# Patient Record
Sex: Male | Born: 1995 | Race: White | Hispanic: No | Marital: Single | Smoking: Never smoker
Health system: Southern US, Community
[De-identification: ages and names within clinical notes are randomized; demographics above are authoritative.]

## PROBLEM LIST (undated history)

## (undated) DIAGNOSIS — I10 Essential (primary) hypertension: Secondary | ICD-10-CM

## (undated) HISTORY — PX: FRACTURE SURGERY: SHX138

## (undated) HISTORY — PX: HERNIA REPAIR: SHX51

## (undated) HISTORY — PX: NASAL SINUS SURGERY: SHX719

---
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
## 2015-02-27 IMAGING — CR DG HAND COMPLETE 3+V*L*
1 series · 3 of 3 positions shown · non-contrast
Comparison: None.

CLINICAL DATA: Penetrating trauma by a staple.

EXAM:
LEFT HAND - COMPLETE 3+ VIEW

[Series 1: pa · 0.17mm/px · 3 of 3 slices shown]
[im 1/3]
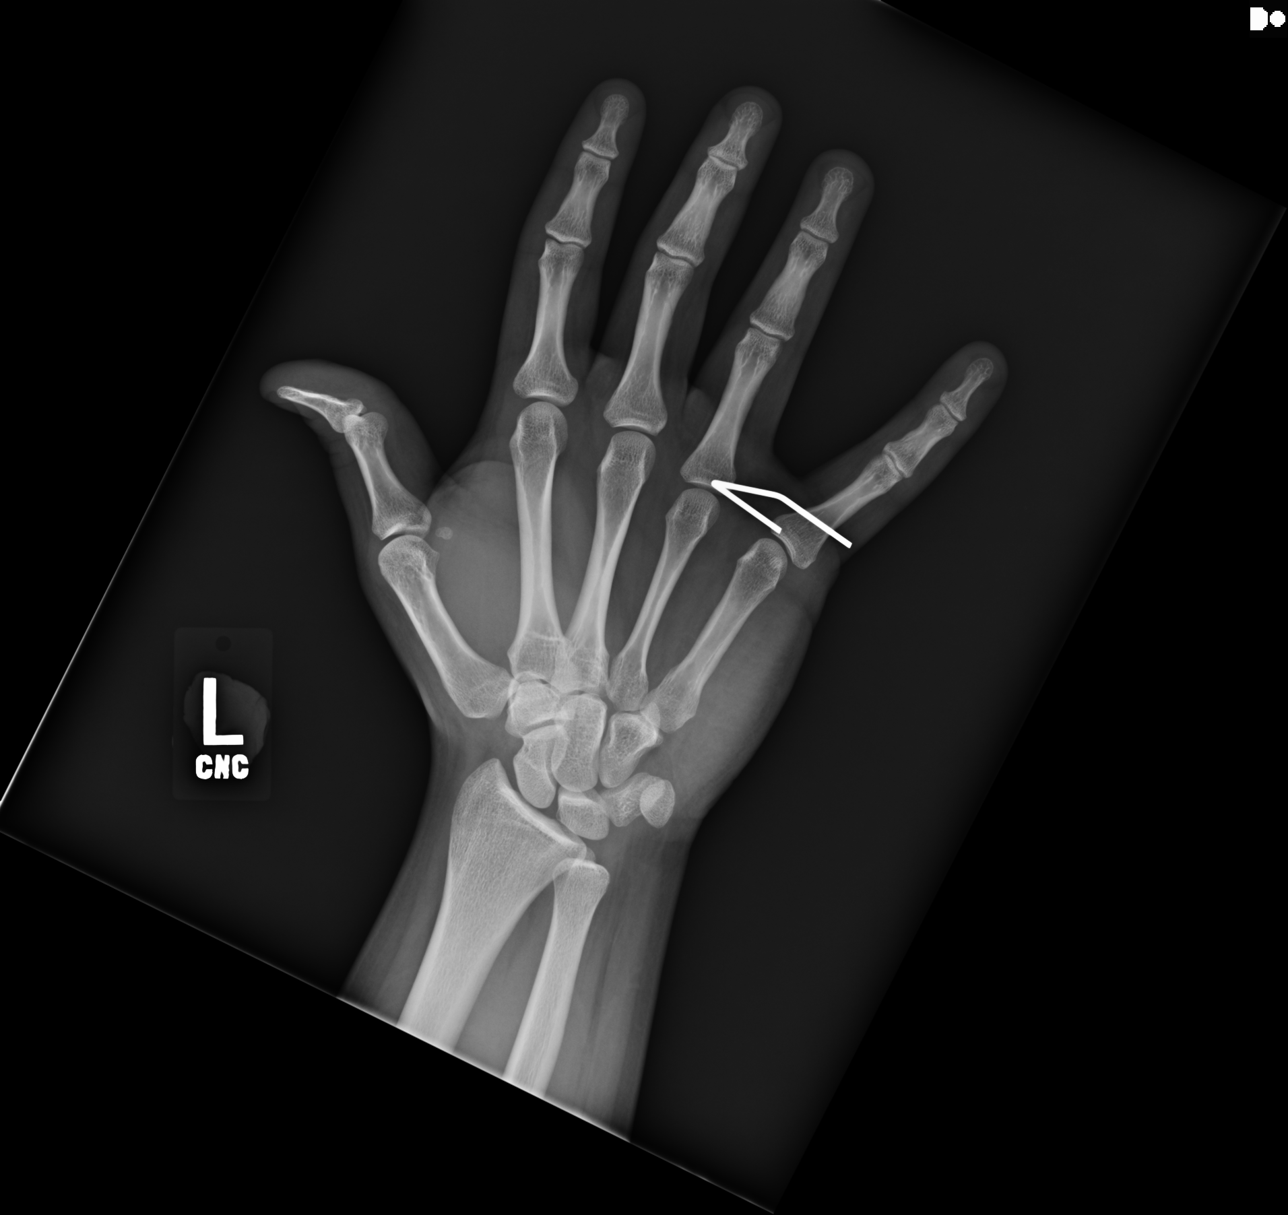
[im 2/3]
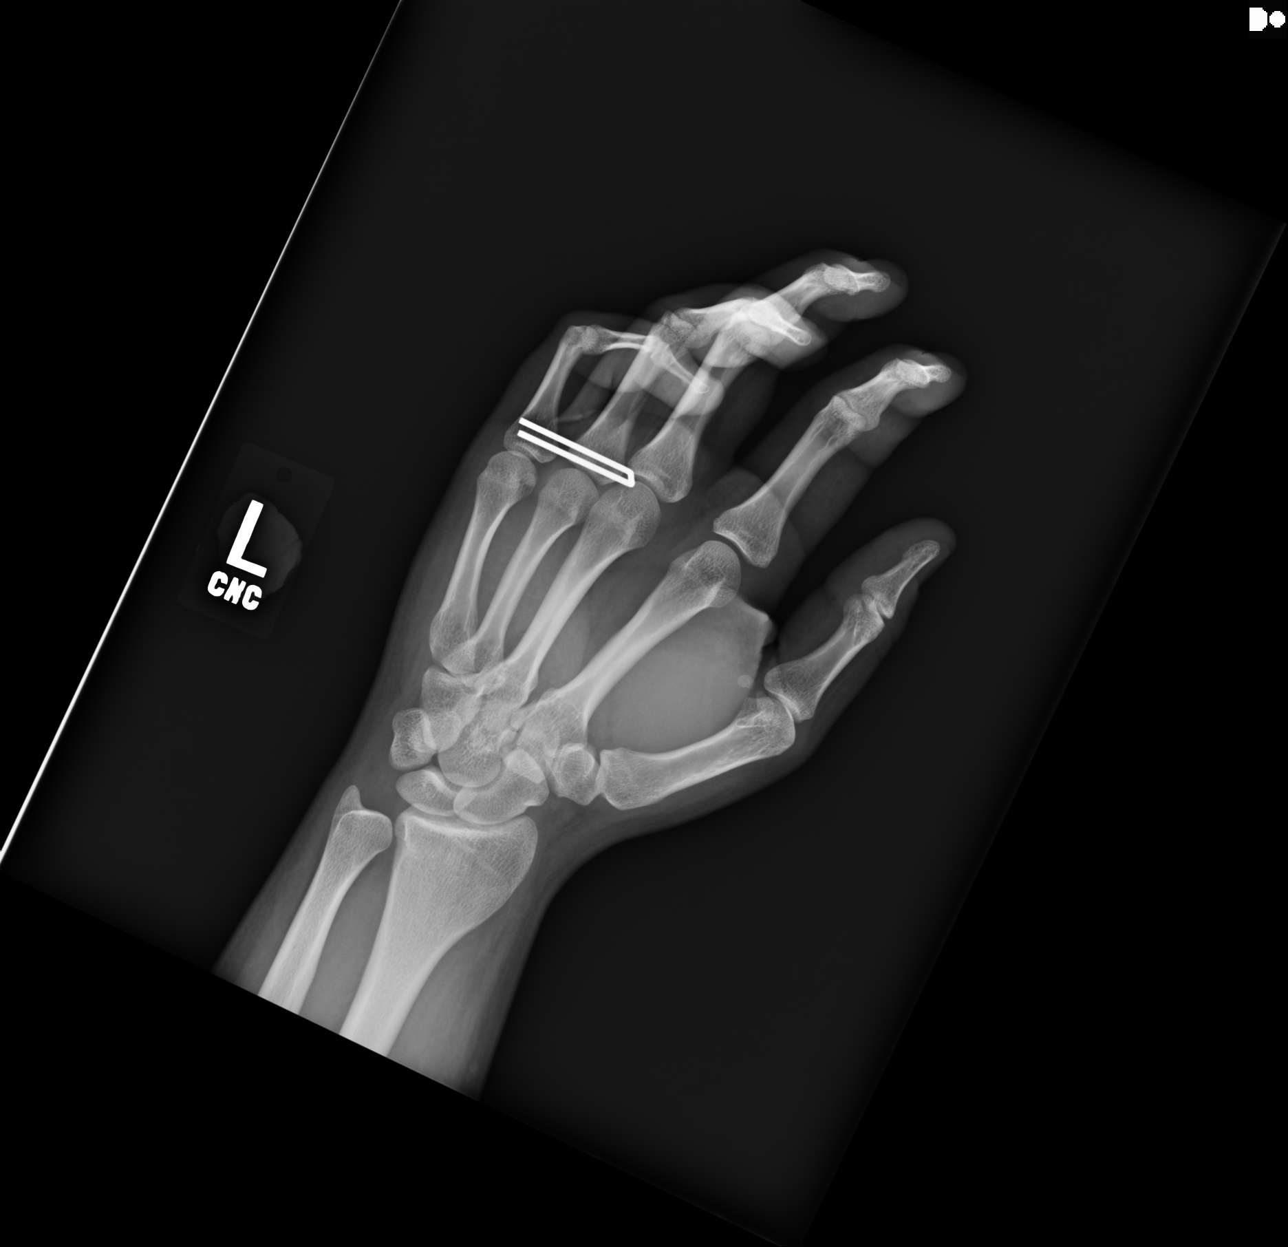
[im 3/3]
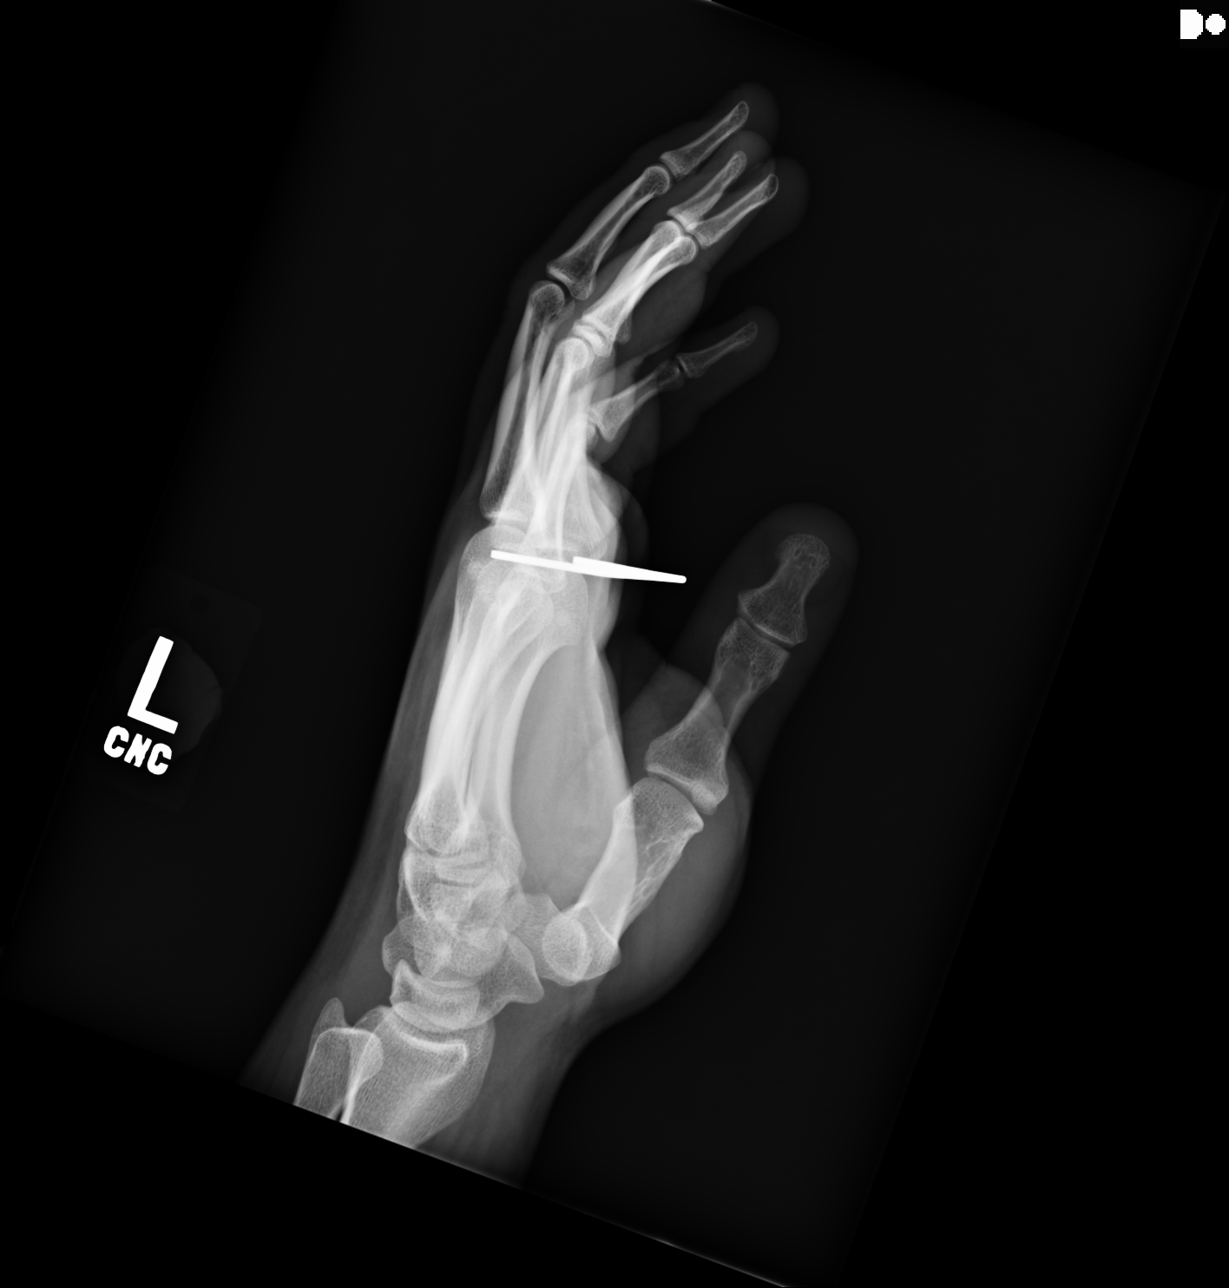

[3 of 3 positions shown; findings below may reference images not displayed]

FINDINGS: There is a large staple in the soft tissues at the volar aspect of
the fifth metacarpal phalangeal joint. The staple does not appear to
be within the bone. There is no fracture. The osseous structures
appear normal.
IMPRESSION: Staple in the soft tissues around the fifth metacarpal phalangeal
joint.

## 2017-07-31 ENCOUNTER — Emergency Department
Admission: EM | Admit: 2017-07-31 | Discharge: 2017-07-31 | Disposition: A | Discharge status: Home / Self Care | Payer: Commercial Managed Care - PPO | Attending: Emergency Medicine | Admitting: Emergency Medicine

## 2017-07-31 ENCOUNTER — Encounter: Payer: Self-pay | Admitting: Emergency Medicine

## 2017-07-31 DIAGNOSIS — J019 Acute sinusitis, unspecified: Secondary | ICD-10-CM | POA: Diagnosis not present

## 2017-07-31 DIAGNOSIS — J0191 Acute recurrent sinusitis, unspecified: Secondary | ICD-10-CM

## 2017-07-31 DIAGNOSIS — R0981 Nasal congestion: Secondary | ICD-10-CM | POA: Insufficient documentation

## 2017-07-31 HISTORY — DX: Essential (primary) hypertension: I10

## 2017-07-31 MED ORDER — DM-GUAIFENESIN ER 30-600 MG PO TB12: 1.0000 | ORAL_TABLET | Freq: Once | ORAL | Status: DC

## 2017-07-31 MED ORDER — AZITHROMYCIN 250 MG PO TABS: ORAL_TABLET | ORAL | 0 refills | Status: AC

## 2017-07-31 MED ORDER — PHENYLEPHRINE HCL 0.5 % NA SOLN
1.0000 [drp] | Freq: Once | NASAL | Status: AC
  Administered 2017-07-31: 1 [drp] via NASAL
  Filled 2017-07-31: qty 15

## 2017-07-31 NOTE — ED Notes (Signed)
Signature pad not responding at this time, pt signed hard copy and placed on chart.

## 2017-07-31 NOTE — ED Triage Notes (Signed)
Patient ambulatory to triage with steady gait, without difficulty or distress noted; pt reports nasal congestion with clear drainage since yesterday

## 2017-07-31 NOTE — ED Notes (Signed)

## 2017-08-01 NOTE — ED Provider Notes (Signed)
[facility] Emergency Department Provider Note   First MD Initiated Contact with Patient 07/31/17 [phone]     (approximate)  I have reviewed the triage vital signs and the nursing notes.   HISTORY  Chief Complaint Nasal Congestion   HPI Alfred Lawrence is a 21 y.o. male presents to the emergency department 1 day history of nasal congestion. Patient denies any cough no congestion. Patient states that the drainage is clear. Patient does admit to using nasal spray regularly secondary to nasal congestion and states that he's been doing so consistently for many months. Patient states that he does take Zyrtec for allergies   Past Medical History:  Diagnosis Date  . Hypertension     There are no active problems to display for this patient.   Past Surgical History:  Procedure Laterality Date  . FRACTURE SURGERY    . HERNIA REPAIR    . NASAL SINUS SURGERY      Prior to Admission medications   Medication Sig Start Date End Date Taking? Authorizing Provider  azithromycin (ZITHROMAX Z-PAK) 250 MG tablet Take 2 tablets (500 mg) on  Day 1,  followed by 1 tablet (250 mg) once daily on Days 2 through 5. 07/31/17 08/05/17  Darci CurrentBrown, Fayette N, MD    Allergies Ibuprofen  No family history on file.  Social History Social History  Substance Use Topics  . Smoking status: Never Smoker  . Smokeless tobacco: Never Used  . Alcohol use No    Review of Systems Constitutional: No fever/chills Eyes: No visual changes. ENT: No sore throat.Positive for nasal congestion Cardiovascular: Denies chest pain. Respiratory: Denies shortness of breath. Gastrointestinal: No abdominal pain.  No nausea, no vomiting.  No diarrhea.  No constipation. Genitourinary: Negative for dysuria. Musculoskeletal: Negative for neck pain.  Negative for back pain. Integumentary: Negative for rash. Neurological: Negative for headaches, focal weakness or  numbness.  ____________________________________________   PHYSICAL EXAM:  VITAL SIGNS: ED Triage Vitals [07/31/17 0448]  Enc Vitals Group     BP (!) 161/95     Pulse Rate 88     Resp 20     Temp 97.8 F (36.6 C)     Temp Source Oral     SpO2 100 %     Weight 117.9 kg (260 lb)     Height 1.829 m (6')     Head Circumference      Peak Flow      Pain Score      Pain Loc      Pain Edu?      Excl. in GC?     Constitutional: Alert and oriented. Well appearing and in no acute distress. Eyes: Conjunctivae are normal. Head: Atraumatic. Ears:  Healthy appearing ear canals and TMs bilaterally Nose: Positive for congestion/rhinnorhea. Mouth/Throat: Mucous membranes are moist.  Oropharynx non-erythematous. Neck: No stridor. Cardiovascular: Normal rate, regular rhythm. Good peripheral circulation. Grossly normal heart sounds. Respiratory: Normal respiratory effort.  No retractions. Lungs CTAB. Gastrointestinal: Soft and nontender. No distention.  Musculoskeletal: No lower extremity tenderness nor edema. No gross deformities of extremities. Neurologic:  Normal speech and language. No gross focal neurologic deficits are appreciated.  Skin:  Skin is warm, dry and intact. No rash noted. Psychiatric: Mood and affect are normal. Speech and behavior are normal.   Procedures   ____________________________________________   INITIAL IMPRESSION / ASSESSMENT AND PLAN / ED COURSE  Pertinent labs & imaging results that were available during my care of the patient  were reviewed by me and considered in my medical decision making (see chart for details).        ____________________________________________  FINAL CLINICAL IMPRESSION(S) / ED DIAGNOSES  Final diagnoses:  Nasal congestion  Acute recurrent sinusitis, unspecified location     MEDICATIONS GIVEN DURING THIS VISIT:  Medications  phenylephrine (NEO-SYNEPHRINE) 0.5 % nasal solution 1 drop (1 drop Each Nare Given 07/31/17  0554)     NEW OUTPATIENT MEDICATIONS STARTED DURING THIS VISIT:  Discharge Medication List as of 07/31/2017  5:41 AM    START taking these medications   Details  azithromycin (ZITHROMAX Z-PAK) 250 MG tablet Take 2 tablets (500 mg) on  Day 1,  followed by 1 tablet (250 mg) once daily on Days 2 through 5., Print        Discharge Medication List as of 07/31/2017  5:41 AM      Discharge Medication List as of 07/31/2017  5:41 AM       Note:  This document was prepared using Dragon voice recognition software and may include unintentional dictation errors.    Darci Current, MD 08/01/17 [phone]

## 2022-12-31 ENCOUNTER — Ambulatory Visit
Admission: EM | Admit: 2022-12-31 | Discharge: 2022-12-31 | Disposition: A | Discharge status: Home / Self Care | Payer: 59 | Attending: Urgent Care | Admitting: Urgent Care

## 2022-12-31 DIAGNOSIS — J069 Acute upper respiratory infection, unspecified: Secondary | ICD-10-CM

## 2022-12-31 LAB — POCT RAPID STREP A (OFFICE): Rapid Strep A Screen: NEGATIVE

## 2022-12-31 NOTE — ED Triage Notes (Signed)
Pt. Presents to UC w/ c/o a productive cough, SOB and a sore throat for the past 4 days.

## 2022-12-31 NOTE — Discharge Instructions (Addendum)
You have been diagnosed with a viral upper respiratory infection based on your symptoms and exam. Viral illnesses cannot be treated with antibiotics - they are self limiting - and you should find your symptoms resolving within a few days. Get plenty of rest and non-caffeinated fluids. Watch for signs of dehydration including reduced urine output and dark colored urine.  We recommend you use over-the-counter medications for symptom control including acetaminophen (Tylenol), ibuprofen (Advil/Motrin) or naproxen (Aleve) for throat pain, fever, chills or body aches. You may combine use of acetaminophen and ibuprofen/naproxen if needed.  Some patients find an pain-relieving throat spray such as Chloraseptic to be effective.  Also recommend cold/cough medication containing a cough suppressant such as dextromethorphan, as needed. Please note that some cough medications are not recommended if you suffer from hypertension.    Saline mist spray is helpful for removing excess mucus from your nose.  Room humidifiers are helpful to ease breathing at night. I recommend guaifenesin (Mucinex) with plenty of water throughout the day to help thin and loosen mucus secretions in your respiratory passages.   If appropriate based upon your other medical problems, you might also find relief of nasal/sinus congestion symptoms by using a nasal decongestant such as fluticasone (Flonase ) or pseudoephedrine (Sudafed sinus).  You will need to obtain Sudafed from behind the pharmacist counter.  Speak to the pharmacist to verify that you are not duplicating medications with other over-the-counter formulations that you may be using.   

## 2022-12-31 NOTE — ED Provider Notes (Signed)
[name]    CSN: [id number] Arrival date & time: 12/31/22  1746      History   Chief Complaint No chief complaint on file.   HPI Alfred Lawrence is a 27 y.o. male.   HPI   Presents to urgent care with complaint of productive cough, shortness of breath, sore throat x 4 days.  Patient states his sore throat is worsened when he coughs.  Also finds difficulty to perform tasks with even minor exertion due to increased heart rate and "cannot catch my breath".  Patient is treated for hypertension and tachycardia with metoprolol.  He endorses recent office visit with blood pressure systolic 193X and heart rate in the 80s.  Denies fever, chills, body aches.  Past Medical History:  Diagnosis Date   Hypertension     There are no problems to display for this patient.   Past Surgical History:  Procedure Laterality Date   FRACTURE SURGERY     HERNIA REPAIR     NASAL SINUS SURGERY         Home Medications    Prior to Admission medications   Not on File    Family History No family history on file.  Social History Social History   Tobacco Use   Smoking status: Never   Smokeless tobacco: Never  Substance Use Topics   Alcohol use: No   Drug use: No     Allergies   Ibuprofen   Review of Systems Review of Systems   Physical Exam Triage Vital Signs ED Triage Vitals  Enc Vitals Group     BP      Pulse      Resp      Temp      Temp src      SpO2      Weight      Height      Head Circumference      Peak Flow      Pain Score      Pain Loc      Pain Edu?      Excl. in [place]?    No data found.  Updated Vital Signs There were no vitals taken for this visit.  Visual Acuity Right Eye Distance:   Left Eye Distance:   Bilateral Distance:    Right Eye Near:   Left Eye Near:    Bilateral Near:     Physical Exam Vitals reviewed.  Constitutional:      Appearance: Normal appearance.  HENT:     Mouth/Throat:     Mouth: Mucous  membranes are moist.     Pharynx: No oropharyngeal exudate or posterior oropharyngeal erythema.     Tonsils: No tonsillar exudate. 3+ on the right. 3+ on the left.  Cardiovascular:     Rate and Rhythm: Normal rate and regular rhythm.     Pulses: Normal pulses.     Heart sounds: Normal heart sounds.  Pulmonary:     Effort: Pulmonary effort is normal.     Breath sounds: Normal breath sounds.  Skin:    General: Skin is warm and dry.  Neurological:     General: No focal deficit present.     Mental Status: He is alert and oriented to person, place, and time.  Psychiatric:        Mood and Affect: Mood normal.        Behavior: Behavior normal.      UC Treatments / Results  Labs (all labs  ordered are listed, but only abnormal results are displayed) Labs Reviewed - No data to display  EKG   Radiology No results found.  Procedures Procedures (including critical care time)  Medications Ordered in UC Medications - No data to display  Initial Impression / Assessment and Plan / UC Course  I have reviewed the triage vital signs and the nursing notes.  Pertinent labs & imaging results that were available during my care of the patient were reviewed by me and considered in my medical decision making (see chart for details).   Patient is afebrile here without recent antipyretics. Satting well on room air. Overall is well appearing, well hydrated, without respiratory distress. Pulmonary exam is unremarkable.  Lungs CTAB without wheezing, rhonchi, rales.  Tonsils are 3+ bilaterally pharynx is mildly erythematous without peritonsillar exudate.  Given his erythematous oropharynx and enlarged tonsils, rapid strep is obtained and is negative. Symptoms are consistent with an acute viral process.  Recommending OTC medication for symptom control.  Final Clinical Impressions(s) / UC Diagnoses   Final diagnoses:  None   Discharge Instructions   None    ED Prescriptions   None    PDMP  not reviewed this encounter.   Rose Phi, [place] 12/31/22 [phone]

## 2023-01-03 ENCOUNTER — Ambulatory Visit
Admission: EM | Admit: 2023-01-03 | Discharge: 2023-01-03 | Disposition: A | Discharge status: Home / Self Care | Payer: 59

## 2023-01-03 DIAGNOSIS — J0100 Acute maxillary sinusitis, unspecified: Secondary | ICD-10-CM

## 2023-01-03 DIAGNOSIS — J029 Acute pharyngitis, unspecified: Secondary | ICD-10-CM

## 2023-01-03 MED ORDER — AMOXICILLIN 875 MG PO TABS: 875.0000 mg | ORAL_TABLET | Freq: Two times a day (BID) | ORAL | 0 refills | Status: AC

## 2023-01-03 NOTE — Discharge Instructions (Addendum)
Take the amoxicillin as directed.  Follow up with your primary care provider if your symptoms are not improving.   ° ° °

## 2023-01-03 NOTE — ED Provider Notes (Signed)
Alfred Lawrence    CSN: [id number] Arrival date & time: 01/03/23  1250      History   Chief Complaint Chief Complaint  Patient presents with   Sore Throat    HPI Alfred Lawrence is a 27 y.o. male.  Patient presents with 8 day history of sore throat, hoarse voice, postnasal drip, congestion, cough, shortness of breath with exertion.  Treatment at home with throat spray and lozenges.  No fever, rash, chest pain, vomiting, diarrhea, or other symptoms.  Patient was seen at this urgent care on 12/31/2022; diagnosed with viral uri; rapid strep negative; symptomatic treatment.   The history is provided by the patient and medical records.    Past Medical History:  Diagnosis Date   Hypertension     There are no problems to display for this patient.   Past Surgical History:  Procedure Laterality Date   FRACTURE SURGERY     HERNIA REPAIR     NASAL SINUS SURGERY         Home Medications    Prior to Admission medications   Medication Sig Start Date End Date Taking? Authorizing Provider  AMBIEN 10 MG tablet Take 10 mg by mouth at bedtime as needed. 12/23/22  Yes [provider]  metoprolol succinate (TOPROL-XL) 50 MG 24 hr tablet Take 50 mg by mouth daily. 10/06/22  Yes [provider]  STRATTERA 40 MG capsule Take by mouth. 12/23/22  Yes [provider]  traZODone (DESYREL) 50 MG tablet Take by mouth. 10/06/22  Yes [provider]  venlafaxine XR (EFFEXOR-XR) 75 MG 24 hr capsule Take 225 mg by mouth daily. 10/06/22  Yes [provider]  amoxicillin (AMOXIL) 875 MG tablet Take 1 tablet (875 mg total) by mouth 2 (two) times daily for 10 days. 01/03/23 01/13/23 Yes Sharion Balloon, NP    Family History History reviewed. No pertinent family history.  Social History Social History   Tobacco Use   Smoking status: Never   Smokeless tobacco: Never  Substance Use Topics   Alcohol use: No   Drug use: No     Allergies    Ibuprofen   Review of Systems Review of Systems  Constitutional:  Negative for chills and fever.  HENT:  Positive for congestion, postnasal drip and sore throat. Negative for ear pain.   Respiratory:  Positive for cough and shortness of breath.   Cardiovascular:  Negative for chest pain and palpitations.  Gastrointestinal:  Negative for diarrhea and vomiting.  Skin:  Negative for color change and rash.  All other systems reviewed and are negative.    Physical Exam Triage Vital Signs ED Triage Vitals [01/03/23 1303]  Enc Vitals Group     BP      Pulse Rate 93     Resp 18     Temp 98 F (36.7 C)     Temp src      SpO2 98 %     Weight      Height      Head Circumference      Peak Flow      Pain Score      Pain Loc      Pain Edu?      Excl. in [place]?    No data found.  Updated Vital Signs BP 124/88   Pulse 93   Temp 98 F (36.7 C)   Resp 18   Ht 5\' 11"  (1.803 m)   Wt 290 lb (131.5  kg)   SpO2 98%   BMI 40.45 kg/m   Visual Acuity Right Eye Distance:   Left Eye Distance:   Bilateral Distance:    Right Eye Near:   Left Eye Near:    Bilateral Near:     Physical Exam Vitals and nursing note reviewed.  Constitutional:      General: He is not in acute distress.    Appearance: He is well-developed. He is obese. He is not ill-appearing.  HENT:     Right Ear: Tympanic membrane normal.     Left Ear: Tympanic membrane normal.     Nose: Congestion present.     Mouth/Throat:     Mouth: Mucous membranes are moist.     Pharynx: Posterior oropharyngeal erythema present.  Cardiovascular:     Rate and Rhythm: Normal rate and regular rhythm.     Heart sounds: Normal heart sounds.  Pulmonary:     Effort: Pulmonary effort is normal. No respiratory distress.     Breath sounds: Normal breath sounds. No wheezing, rhonchi or rales.  Musculoskeletal:     Cervical back: Neck supple.  Skin:    General: Skin is warm and dry.  Neurological:     Mental Status: He is alert.   Psychiatric:        Mood and Affect: Mood normal.        Behavior: Behavior normal.      UC Treatments / Results  Labs (all labs ordered are listed, but only abnormal results are displayed) Labs Reviewed - No data to display  EKG   Radiology No results found.  Procedures Procedures (including critical care time)  Medications Ordered in UC Medications - No data to display  Initial Impression / Assessment and Plan / UC Course  I have reviewed the triage vital signs and the nursing notes.  Pertinent labs & imaging results that were available during my care of the patient were reviewed by me and considered in my medical decision making (see chart for details).    Acute pharyngitis, acute sinusitis.  No respiratory distress, O2 sat 98% on room air.  Patient has been symptomatic for 8 days and his symptoms are getting worse.  Treating with amoxicillin.  Education provided on sore throat and sinus infection.  Instructed patient to follow up with his PCP if his symptoms are not improving.  he agrees to plan of care.    Final Clinical Impressions(s) / UC Diagnoses   Final diagnoses:  Acute pharyngitis, unspecified etiology  Acute non-recurrent maxillary sinusitis     Discharge Instructions      Take the amoxicillin as directed.  Follow up with your primary care provider if your symptoms are not improving.        ED Prescriptions     Medication Sig Dispense Auth. Provider   amoxicillin (AMOXIL) 875 MG tablet Take 1 tablet (875 mg total) by mouth 2 (two) times daily for 10 days. 20 tablet Sharion Balloon, NP      PDMP not reviewed this encounter.   Sharion Balloon, NP 01/03/23 1345

## 2023-01-03 NOTE — ED Triage Notes (Signed)
Patient to Urgent Care with complaints of sore throat and swollen tonsils x1 week. Reports seeing spots on his tonsils.   Patient was seen 1/10 with a negative strep test but has continued to have symptoms. Denies any fevers, n/v, HA.   Using lozenges and throat spray.

## 2023-01-08 ENCOUNTER — Encounter: Payer: Self-pay | Admitting: Emergency Medicine

## 2023-01-08 ENCOUNTER — Emergency Department
Admission: EM | Admit: 2023-01-08 | Discharge: 2023-01-08 | Disposition: A | Discharge status: Home / Self Care | Payer: 59 | Attending: Emergency Medicine | Admitting: Emergency Medicine

## 2023-01-08 ENCOUNTER — Emergency Department: Payer: 59

## 2023-01-08 ENCOUNTER — Other Ambulatory Visit: Payer: Self-pay

## 2023-01-08 DIAGNOSIS — J039 Acute tonsillitis, unspecified: Secondary | ICD-10-CM

## 2023-01-08 DIAGNOSIS — J029 Acute pharyngitis, unspecified: Secondary | ICD-10-CM | POA: Diagnosis present

## 2023-01-08 LAB — CBC WITH DIFFERENTIAL/PLATELET
Abs Immature Granulocytes: 0.05 10*3/uL (ref 0.00–0.07)
Basophils Absolute: 0.1 10*3/uL (ref 0.0–0.1)
Basophils Relative: 1 %
Eosinophils Absolute: 0.2 10*3/uL (ref 0.0–0.5)
Eosinophils Relative: 2 %
HCT: 43.5 % (ref 39.0–52.0)
Hemoglobin: 14.5 g/dL (ref 13.0–17.0)
Immature Granulocytes: 1 %
Lymphocytes Relative: 19 %
Lymphs Abs: 1.9 10*3/uL (ref 0.7–4.0)
MCH: 27.6 pg (ref 26.0–34.0)
MCHC: 33.3 g/dL (ref 30.0–36.0)
MCV: 82.9 fL (ref 80.0–100.0)
Monocytes Absolute: 0.7 10*3/uL (ref 0.1–1.0)
Monocytes Relative: 7 %
Neutro Abs: 7.2 10*3/uL (ref 1.7–7.7)
Neutrophils Relative %: 70 %
Platelets: 317 10*3/uL (ref 150–400)
RBC: 5.25 MIL/uL (ref 4.22–5.81)
RDW: 12.9 % (ref 11.5–15.5)
WBC: 10.2 10*3/uL (ref 4.0–10.5)
nRBC: 0 % (ref 0.0–0.2)

## 2023-01-08 LAB — COMPREHENSIVE METABOLIC PANEL
ALT: 17 U/L (ref 0–44)
AST: 19 U/L (ref 15–41)
Albumin: 4.3 g/dL (ref 3.5–5.0)
Alkaline Phosphatase: 96 U/L (ref 38–126)
Anion gap: 8 (ref 5–15)
BUN: 12 mg/dL (ref 6–20)
CO2: 25 mmol/L (ref 22–32)
Calcium: 9.3 mg/dL (ref 8.9–10.3)
Chloride: 102 mmol/L (ref 98–111)
Creatinine, Ser: 1 mg/dL (ref 0.61–1.24)
GFR, Estimated: >60 mL/min (ref >60)
Glucose, Bld: 92 mg/dL (ref 70–99)
Potassium: 3.6 mmol/L (ref 3.5–5.1)
Sodium: 135 mmol/L (ref 135–145)
Total Bilirubin: 0.8 mg/dL (ref 0.3–1.2)
Total Protein: 8.1 g/dL (ref 6.5–8.1)

## 2023-01-08 LAB — MONONUCLEOSIS SCREEN: Mono Screen: NEGATIVE

## 2023-01-08 MED ORDER — MAGIC MOUTHWASH W/LIDOCAINE: 5.0000 mL | Freq: Four times a day (QID) | ORAL | 0 refills | Status: AC

## 2023-01-08 MED ORDER — PREDNISONE 20 MG PO TABS
60.0000 mg | ORAL_TABLET | Freq: Once | ORAL | Status: AC
  Administered 2023-01-08: 60 mg via ORAL
  Filled 2023-01-08: qty 3

## 2023-01-08 MED ORDER — IOHEXOL 300 MG/ML  SOLN
75.0000 mL | Freq: Once | INTRAMUSCULAR | Status: AC | PRN
  Administered 2023-01-08: 75 mL via INTRAVENOUS

## 2023-01-08 MED ORDER — PREDNISONE 50 MG PO TABS: 50.0000 mg | ORAL_TABLET | Freq: Every day | ORAL | 0 refills | Status: AC

## 2023-01-08 NOTE — ED Notes (Signed)
ED Provider at bedside. 

## 2023-01-08 NOTE — ED Notes (Signed)
See triage note  Presents with sore throat for the past 2 weeks    Afebrile on arrival   Has been seen for same  but states he is not any better

## 2023-01-08 NOTE — ED Notes (Addendum)
Patient transported to X-ray 

## 2023-01-08 NOTE — ED Provider Notes (Signed)
[facility] Provider Note  Patient Contact: 7:34 PM (approximate)   History   Sore Throat   HPI  Alfred Lawrence is a 27 y.o. male who presents the emergency department complaining of sore throat.  Patient states he has had sore throat x 2 weeks.  No difficulty breathing or swallowing.  Has been to urgent care twice with negative strep test both time.  Patient was placed empirically on antibiotics and is halfway through a 2-week course of amoxicillin.  Patient states that despite the antibiotic he seems to have been worsening.  States that he has scratchy throat, but no difficulty breathing or swallowing.  No other significant associated symptoms of fevers, chills, congestion.  She does have a little bit of a cough.  No GI symptoms.     Physical Exam   Triage Vital Signs: ED Triage Vitals  Enc Vitals Group     BP 01/08/23 1752 (!) 138/101     Pulse Rate 01/08/23 1752 (!) 106     Resp 01/08/23 1752 18     Temp 01/08/23 1752 98.7 F (37.1 C)     Temp Source 01/08/23 1752 Oral     SpO2 01/08/23 1752 99 %     Weight 01/08/23 1751 290 lb (131.5 kg)     Height 01/08/23 1751 5\' 11"  (1.803 m)     Head Circumference --      Peak Flow --      Pain Score 01/08/23 1751 4     Pain Loc --      Pain Edu? --      Excl. in [place]? --     Most recent vital signs: Vitals:   01/08/23 1752  BP: (!) 138/101  Pulse: (!) 106  Resp: 18  Temp: 98.7 F (37.1 C)  SpO2: 99%     General: Alert and in no acute distress. ENT:      Ears:       Nose: No congestion/rhinnorhea.      Mouth/Throat: Mucous membranes are moist.  Visualization of the oropharynx reveals erythema and slight edema both tonsils.  There is worsening tonsillar hypertrophy of the left when compared to right.  No significant uvular deviation. Neck: No stridor. No cervical spine tenderness to palpation.  Cardiovascular:  Good peripheral perfusion Respiratory: Normal respiratory effort without tachypnea  or retractions. Lungs CTAB. Good air entry to the bases with no decreased or absent breath sounds Musculoskeletal: Full range of motion to all extremities.  Neurologic:  No gross focal neurologic deficits are appreciated.  Skin:   No rash noted Other:   ED Results / Procedures / Treatments   Labs (all labs ordered are listed, but only abnormal results are displayed) Labs Reviewed  COMPREHENSIVE METABOLIC PANEL  CBC WITH DIFFERENTIAL/PLATELET  MONONUCLEOSIS SCREEN     EKG     RADIOLOGY  I personally viewed, evaluated, and interpreted these images as part of my medical decision making, as well as reviewing the written report by the radiologist.  ED Provider Interpretation: No acute findings in the neck.  Findings consistent with tonsillitis.  CT Soft Tissue Neck W Contrast  Result Date: 01/08/2023 CLINICAL DATA:  Sore throat for 2 weeks, epiglottitis or tonsillitis suspected EXAM: CT NECK WITH CONTRAST TECHNIQUE: Multidetector CT imaging of the neck was performed using the standard protocol following the bolus administration of intravenous contrast. RADIATION DOSE REDUCTION: This exam was performed according to the departmental dose-optimization program which includes automated exposure control, adjustment  of the mA and/or kV according to patient size and/or use of iterative reconstruction technique. CONTRAST:  44mL OMNIPAQUE IOHEXOL 300 MG/ML  SOLN COMPARISON:  None Available. FINDINGS: Pharynx and larynx: Enlargement of the left greater than right palatine tonsil, without low-density or peripherally enhancing collection. Larynx is unremarkable. Salivary glands: No inflammation, mass, or stone. Thyroid: Normal. Lymph nodes: None enlarged or abnormal density. Vascular: Patent. Limited intracranial: Negative. Visualized orbits: Negative. Mastoids and visualized paranasal sinuses: Clear. Skeleton: No acute osseous abnormality. Upper chest: No focal pulmonary opacity or pleural effusion.  Other: None. IMPRESSION: Enlargement of the left greater than right palatine tonsils, consistent with tonsillitis/pharyngitis, without evidence of peritonsillar abscess. Electronically Signed   By: Merilyn Baba M.D.   On: 01/08/2023 21:45   DG Chest 2 View  Result Date: 01/08/2023 CLINICAL DATA:  cough EXAM: CHEST - 2 VIEW COMPARISON:  Chest x-ray 01/10/2012 FINDINGS: The heart and mediastinal contours are within normal limits. No focal consolidation. No pulmonary edema. No pleural effusion. No pneumothorax. No acute osseous abnormality. IMPRESSION: No active cardiopulmonary disease. Electronically Signed   By: Iven Finn M.D.   On: 01/08/2023 20:14    PROCEDURES:  Critical Care performed: No  Procedures   MEDICATIONS ORDERED IN ED: Medications  predniSONE (DELTASONE) tablet 60 mg (has no administration in time range)  iohexol (OMNIPAQUE) 300 MG/ML solution 75 mL (75 mLs Intravenous Contrast Given 01/08/23 2030)     IMPRESSION / MDM / ASSESSMENT AND PLAN / ED COURSE  I reviewed the triage vital signs and the nursing notes.                                 Differential diagnosis includes, but is not limited to, tonsillitis, pharyngitis, strep, mono, peritonsillar abscess, retropharyngeal abscess  Patient's presentation is most consistent with acute presentation with potential threat to life or bodily function.   Patient's diagnosis is consistent with tonsillitis.  Patient presents emergency department with bilateral sore throat x 2 weeks.  He has had 2 negative strep test.  He is on antibiotics with no improvement.  Findings were consistent with tonsillitis versus peritonsillar abscess versus retropharyngeal abscess.  Patient had reassuring imaging and labs.  Suspect tonsillitis at this time.  Magic mouthwash and short course of steroids prescribed.  Recommend follow-up with ENT if symptoms or not improving after this round of treatment.  Return precautions discussed with the  patient.. Patient is given ED precautions to return to the ED for any worsening or new symptoms.     FINAL CLINICAL IMPRESSION(S) / ED DIAGNOSES   Final diagnoses:  Tonsillitis     Rx / DC Orders   ED Discharge Orders          Ordered    predniSONE (DELTASONE) 50 MG tablet  Daily with breakfast        01/08/23 2201    magic mouthwash w/lidocaine SOLN  4 times daily       Note to Pharmacy: Dispense in a 1/1/1 ratio. Use lidocaine, diphenhydramine, prednisolone   01/08/23 2201             Note:  This document was prepared using Dragon voice recognition software and may include unintentional dictation errors.   Brynda Peon 01/08/23 2203    Lavonia Drafts, MD 01/11/23 1054

## 2023-01-08 NOTE — ED Notes (Signed)
Patient transported to CT 

## 2023-01-08 NOTE — ED Triage Notes (Signed)
Patient to ED via POV for sore throat. States symptoms ongoing for past 2 weeks. Tested twice for strep with negative results- refusing to being swabbed again. NAD noted. Speaking in full sentences.

## 2023-01-08 NOTE — ED Triage Notes (Signed)
C/o sore throat x 2 weeks. Seen through Urgent Care for same twice.  Started on Augmentin, not feeling better.   Also c/o SOB at times.  AAOx3.  Skin warm and dry. NAD
